# Patient Record
Sex: Male | Born: 1965 | Race: White | Hispanic: No | Marital: Married | Smoking: Current every day smoker
Health system: Southern US, Community
[De-identification: ages and names within clinical notes are randomized; demographics above are authoritative.]

## PROBLEM LIST (undated history)

## (undated) DIAGNOSIS — G510 Bell's palsy: Secondary | ICD-10-CM

## (undated) DIAGNOSIS — T7840XA Allergy, unspecified, initial encounter: Secondary | ICD-10-CM

## (undated) DIAGNOSIS — R7303 Prediabetes: Secondary | ICD-10-CM

## (undated) DIAGNOSIS — E785 Hyperlipidemia, unspecified: Secondary | ICD-10-CM

## (undated) HISTORY — PX: CARPAL TUNNEL RELEASE: SHX101

## (undated) HISTORY — PX: KNEE SURGERY: SHX244

## (undated) HISTORY — DX: Bell's palsy: G51.0

## (undated) HISTORY — DX: Allergy, unspecified, initial encounter: T78.40XA

## (undated) HISTORY — DX: Hyperlipidemia, unspecified: E78.5

## (undated) HISTORY — DX: Prediabetes: R73.03

---

## 2015-10-27 ENCOUNTER — Telehealth: Payer: Self-pay | Admitting: Family Medicine

## 2015-10-27 NOTE — Telephone Encounter (Signed)
Pt given appt 7/14 at 2:25 with Dr.Dettinger. Pt aware to arrive 30 minutes prior.

## 2015-10-28 ENCOUNTER — Ambulatory Visit: Payer: Self-pay | Admitting: Family Medicine

## 2015-11-07 ENCOUNTER — Ambulatory Visit (INDEPENDENT_AMBULATORY_CARE_PROVIDER_SITE_OTHER): Payer: 59 | Admitting: Family Medicine

## 2015-11-07 ENCOUNTER — Encounter: Payer: Self-pay | Admitting: Family Medicine

## 2015-11-07 VITALS — BP 111/58 | HR 78 | Temp 97.4°F | Ht 72.0 in | Wt 231.4 lb

## 2015-11-07 DIAGNOSIS — N400 Enlarged prostate without lower urinary tract symptoms: Secondary | ICD-10-CM

## 2015-11-07 DIAGNOSIS — R1032 Left lower quadrant pain: Secondary | ICD-10-CM

## 2015-11-07 DIAGNOSIS — Z0000 Encounter for general adult medical examination without abnormal findings: Secondary | ICD-10-CM | POA: Diagnosis not present

## 2015-11-07 DIAGNOSIS — R300 Dysuria: Secondary | ICD-10-CM | POA: Diagnosis not present

## 2015-11-07 DIAGNOSIS — Z87442 Personal history of urinary calculi: Secondary | ICD-10-CM | POA: Diagnosis not present

## 2015-11-07 DIAGNOSIS — R079 Chest pain, unspecified: Secondary | ICD-10-CM | POA: Insufficient documentation

## 2015-11-07 DIAGNOSIS — R7303 Prediabetes: Secondary | ICD-10-CM

## 2015-11-07 LAB — URINALYSIS, COMPLETE
BILIRUBIN UA: NEGATIVE
Ketones, UA: NEGATIVE
Leukocytes, UA: NEGATIVE
NITRITE UA: NEGATIVE
Protein, UA: NEGATIVE
Specific Gravity, UA: >1.03 — ABNORMAL HIGH (ref 1.005–1.030)
UUROB: 0.2 mg/dL (ref 0.2–1.0)
pH, UA: 5.5 (ref 5.0–7.5)

## 2015-11-07 LAB — BAYER DCA HB A1C WAIVED: HB A1C: 6.7 % (ref <7.0)

## 2015-11-07 LAB — MICROSCOPIC EXAMINATION
Epithelial Cells (non renal): NONE SEEN /hpf (ref 0–10)
WBC UA: NONE SEEN /HPF (ref 0–?)

## 2015-11-07 MED ORDER — TAMSULOSIN HCL 0.4 MG PO CAPS: 0.4000 mg | ORAL_CAPSULE | Freq: Every day | ORAL | Status: DC

## 2015-11-07 NOTE — Patient Instructions (Signed)
Great to see yoU!  Lets have you back in 2 weeks to see how you are doing.   We will call within 1 week with labs.

## 2015-11-07 NOTE — Progress Notes (Signed)
 HPI  Patient presents today here with left lower quadrant pain to establish care.  Patient explains that over the last 1 month he's had left lower quadrant pain. He states that a few years ago he first had left flank pain which resolved after passing a kidney stone. About a month ago he had similar pain that he does currently, left lower quadrant dull achy persistent pain, that was resolved after passing another kidney stone. He states that however the last few weeks he's had it intermittently along with difficulty passing urine. He also has occasional dysuria.  He states that currently he has no problems passing urine. He does have continued left lower quadrant pain.  Left chest pain He has left-sided chest pain that resolved with pushing forcefully on his left chest port in towards his shoulder. He denies any exertional symptoms, dyspnea associated, palpitations, or leg edema.  Prediabetes Previously told he had prediabetes He continues to drink 10-12 bottles of Mountain Dew's daily He states that he has blurred vision intermittently which he attributes to high sugar.  He is amenable to getting colonoscopy.    PMH: Smoking status noted Past medical history of Bell's palsy and hyperlipidemia. His father and sister had stomach and breast cancer respectively He is a history of carpal tunnel release and left knee surgery. He is a smoker  ROS: Per HPI  Objective: BP (!) 111/58   Pulse 78   Temp 97.4 F (36.3 C) (Oral)   Ht 6' (1.829 m)   Wt 231 lb 6.4 oz (105 kg)   BMI 31.38 kg/m  Gen: NAD, alert, cooperative with exam HEENT: NCAT, EOMI, PERRL CV: RRR, good S1/S2, no murmur  chest wall: Pain reproduced with palpation of the left sternal border and left pectoral muscle Resp: CTABL, no wheezes, non-labored Abd: Soft, mild tenderness to palpation left lower quadrant Ext: No edema, warm Neuro: Alert and oriented DRE: Large smooth prostate, normal rectal tone  EKG with  normal sinus rhythm  Assessment and plan:  # Left lower quadrant pain With history of renal stones and persistent pain similar to previous episodes of passing stones I have ordered a CT renal stone study to evaluate for large stone No active stone today, urinalysis is negative for blood Also consider diverticulosis His abdominal exam today is reassuring  # BPH Start Flomax, if he has stone this could also be beneficial. PSA  # Prediabetes Labs today including A1c Discussed cutting back on sugar sweetened soft drinks  # Chest pain Reproducible on exam Given risk factors I think he likely warrants stress test. Referral to cardiology  Healthcare maintenance Refer to GI for colonoscopy, hep C also checked     Orders Placed This Encounter  Procedures  . Microscopic Examination  . CT RENAL STONE STUDY    Standing Status:   Future    Standing Expiration Date:   02/06/2017    Order Specific Question:   Reason for Exam (SYMPTOM  OR DIAGNOSIS REQUIRED)    Answer:   LLQ pain with Hx of stone    Order Specific Question:   Preferred imaging location?    Answer:   [facility]  . Urinalysis, Complete  . CMP14+EGFR  . PSA  . CBC with Differential  . Lipid Panel  . Bayer DCA Hb A1c Waived  . TSH  . Hepatitis C antibody  . Ambulatory referral to Cardiology    Referral Priority:   Routine    Referral Type:   Consultation      Referral Reason:   Specialty Services Required    Requested Specialty:   Cardiology    Number of Visits Requested:   1  . Ambulatory referral to Gastroenterology    Referral Priority:   Routine    Referral Type:   Consultation    Referral Reason:   Specialty Services Required    Number of Visits Requested:   1  . EKG 12-Lead    Meds ordered this encounter  Medications  . tamsulosin (FLOMAX) capsule 0.4 mg    Laroy Apple, MD [facility] 11/07/2015, 5:46 PM

## 2015-11-08 ENCOUNTER — Telehealth: Payer: Self-pay | Admitting: Family Medicine

## 2015-11-08 ENCOUNTER — Encounter: Payer: Self-pay | Admitting: Gastroenterology

## 2015-11-08 LAB — CBC WITH DIFFERENTIAL/PLATELET
BASOS ABS: 0.1 10*3/uL (ref 0.0–0.2)
BASOS: 1 %
EOS (ABSOLUTE): 0.2 10*3/uL (ref 0.0–0.4)
Eos: 2 %
HEMOGLOBIN: 14.7 g/dL (ref 12.6–17.7)
Hematocrit: 42.5 % (ref 37.5–51.0)
IMMATURE GRANS (ABS): 0 10*3/uL (ref 0.0–0.1)
IMMATURE GRANULOCYTES: 0 %
LYMPHS: 31 %
Lymphocytes Absolute: 2.5 10*3/uL (ref 0.7–3.1)
MCH: 30.8 pg (ref 26.6–33.0)
MCHC: 34.6 g/dL (ref 31.5–35.7)
MCV: 89 fL (ref 79–97)
MONOCYTES: 6 %
Monocytes Absolute: 0.5 10*3/uL (ref 0.1–0.9)
NEUTROS ABS: 4.8 10*3/uL (ref 1.4–7.0)
NEUTROS PCT: 60 %
PLATELETS: 301 10*3/uL (ref 150–379)
RBC: 4.77 x10E6/uL (ref 4.14–5.80)
RDW: 14.2 % (ref 12.3–15.4)
WBC: 8 10*3/uL (ref 3.4–10.8)

## 2015-11-08 LAB — LIPID PANEL
CHOLESTEROL TOTAL: 248 mg/dL — AB (ref 100–199)
Chol/HDL Ratio: 6.9 ratio units — ABNORMAL HIGH (ref 0.0–5.0)
HDL: 36 mg/dL — AB (ref >39)
LDL CALC: 164 mg/dL — AB (ref 0–99)
Triglycerides: 238 mg/dL — ABNORMAL HIGH (ref 0–149)
VLDL CHOLESTEROL CAL: 48 mg/dL — AB (ref 5–40)

## 2015-11-08 LAB — CMP14+EGFR
ALBUMIN: 4.3 g/dL (ref 3.5–5.5)
ALT: 20 IU/L (ref 0–44)
AST: 14 IU/L (ref 0–40)
Albumin/Globulin Ratio: 2 (ref 1.2–2.2)
Alkaline Phosphatase: 67 IU/L (ref 39–117)
BUN / CREAT RATIO: 14 (ref 9–20)
BUN: 15 mg/dL (ref 6–24)
Bilirubin Total: 0.3 mg/dL (ref 0.0–1.2)
CALCIUM: 9.4 mg/dL (ref 8.7–10.2)
CHLORIDE: 100 mmol/L (ref 96–106)
CO2: 27 mmol/L (ref 18–29)
CREATININE: 1.1 mg/dL (ref 0.76–1.27)
GFR calc non Af Amer: 77 mL/min/{1.73_m2} (ref >59)
GFR, EST AFRICAN AMERICAN: 89 mL/min/{1.73_m2} (ref >59)
GLUCOSE: 112 mg/dL — AB (ref 65–99)
Globulin, Total: 2.2 g/dL (ref 1.5–4.5)
Potassium: 5.1 mmol/L (ref 3.5–5.2)
Sodium: 140 mmol/L (ref 134–144)
TOTAL PROTEIN: 6.5 g/dL (ref 6.0–8.5)

## 2015-11-08 LAB — PSA: Prostate Specific Ag, Serum: 0.2 ng/mL (ref 0.0–4.0)

## 2015-11-08 LAB — TSH: TSH: 1.33 u[IU]/mL (ref 0.450–4.500)

## 2015-11-08 MED ORDER — TAMSULOSIN HCL 0.4 MG PO CAPS: 0.4000 mg | ORAL_CAPSULE | Freq: Every day | ORAL | 3 refills | Status: DC

## 2015-11-08 NOTE — Telephone Encounter (Signed)
Called and got his wife. I did not disclose labs results as there is no ROI on file.   She heard me to talk to him about smoking and drinking less mountain dew  Will attempt call again Thursday.   Laroy Apple, MD [facility] 11/08/2015, 5:20 PM

## 2015-11-08 NOTE — Telephone Encounter (Signed)
Medication sent to pharmacy  

## 2015-11-09 LAB — SPECIMEN STATUS REPORT

## 2015-11-09 LAB — HEPATITIS C ANTIBODY: Hep C Virus Ab: <0.1 s/co ratio (ref 0.0–0.9)

## 2015-11-10 ENCOUNTER — Telehealth: Payer: Self-pay | Admitting: Family Medicine

## 2015-11-10 NOTE — Telephone Encounter (Signed)
Review labs, no answer on his cell phone or home phone, message left that we will try again.  Laroy Apple, MD [facility] 11/10/2015, 10:53 AM

## 2015-11-11 ENCOUNTER — Telehealth: Payer: Self-pay | Admitting: Family Medicine

## 2015-11-11 ENCOUNTER — Encounter: Payer: Self-pay | Admitting: Family Medicine

## 2015-11-11 ENCOUNTER — Ambulatory Visit (HOSPITAL_COMMUNITY): Payer: 59

## 2015-11-11 MED ORDER — ATORVASTATIN CALCIUM 20 MG PO TABS: 20.0000 mg | ORAL_TABLET | Freq: Every day | ORAL | 3 refills | Status: AC

## 2015-11-11 NOTE — Telephone Encounter (Signed)
Called and discussed his labs.  He has new diabetes diagnosis. I explained that his diabetes is in the mild range and if he would stop sugar sweetened beverages he may be able to manage it without medication.  Hypercholesterolemia, start Lipitor.  Otherwise prostate and other labs are good.  Repeat LFTs and lipid panel in 6 weeks.  Laroy Apple, MD [facility] 11/11/2015, 5:33 PM

## 2015-11-16 NOTE — Telephone Encounter (Signed)
Left detailed message for patient to call back to set up appoint with clinical pharmacist. Several attempts to call patient. This encounter will be closed.

## 2015-11-16 NOTE — Telephone Encounter (Signed)
lmtcb

## 2015-11-28 ENCOUNTER — Ambulatory Visit (HOSPITAL_COMMUNITY)
Admission: RE | Admit: 2015-11-28 | Discharge: 2015-11-28 | Disposition: A | Discharge status: Home / Self Care | Payer: 59 | Source: Ambulatory Visit | Attending: Family Medicine | Admitting: Family Medicine

## 2015-11-28 ENCOUNTER — Encounter (HOSPITAL_COMMUNITY): Payer: Self-pay

## 2015-11-28 DIAGNOSIS — R1032 Left lower quadrant pain: Secondary | ICD-10-CM | POA: Diagnosis not present

## 2015-11-28 DIAGNOSIS — K8020 Calculus of gallbladder without cholecystitis without obstruction: Secondary | ICD-10-CM | POA: Insufficient documentation

## 2015-11-28 DIAGNOSIS — Z87442 Personal history of urinary calculi: Secondary | ICD-10-CM

## 2015-11-28 DIAGNOSIS — D3502 Benign neoplasm of left adrenal gland: Secondary | ICD-10-CM | POA: Insufficient documentation

## 2015-12-09 ENCOUNTER — Encounter: Payer: Self-pay | Admitting: Cardiology

## 2015-12-20 ENCOUNTER — Telehealth: Payer: Self-pay | Admitting: Physician Assistant

## 2015-12-20 MED ORDER — TAMSULOSIN HCL 0.4 MG PO CAPS: 0.4000 mg | ORAL_CAPSULE | Freq: Every day | ORAL | 3 refills | Status: DC

## 2015-12-20 NOTE — Telephone Encounter (Signed)
Prescription sent to pharmacy.

## 2015-12-26 ENCOUNTER — Encounter: Payer: Self-pay | Admitting: Cardiology

## 2015-12-26 ENCOUNTER — Ambulatory Visit (AMBULATORY_SURGERY_CENTER): Payer: Self-pay | Admitting: *Deleted

## 2015-12-26 ENCOUNTER — Ambulatory Visit (INDEPENDENT_AMBULATORY_CARE_PROVIDER_SITE_OTHER): Payer: 59 | Admitting: Cardiology

## 2015-12-26 VITALS — Ht 72.0 in | Wt 229.0 lb

## 2015-12-26 VITALS — BP 116/68 | HR 66 | Ht 72.0 in | Wt 229.6 lb

## 2015-12-26 DIAGNOSIS — E785 Hyperlipidemia, unspecified: Secondary | ICD-10-CM

## 2015-12-26 DIAGNOSIS — R079 Chest pain, unspecified: Secondary | ICD-10-CM

## 2015-12-26 DIAGNOSIS — Z789 Other specified health status: Secondary | ICD-10-CM

## 2015-12-26 DIAGNOSIS — Z889 Allergy status to unspecified drugs, medicaments and biological substances status: Secondary | ICD-10-CM

## 2015-12-26 DIAGNOSIS — Z720 Tobacco use: Secondary | ICD-10-CM | POA: Diagnosis not present

## 2015-12-26 DIAGNOSIS — Z1211 Encounter for screening for malignant neoplasm of colon: Secondary | ICD-10-CM

## 2015-12-26 MED ORDER — NA SULFATE-K SULFATE-MG SULF 17.5-3.13-1.6 GM/177ML PO SOLN: 1.0000 | Freq: Once | ORAL | 0 refills | Status: AC

## 2015-12-26 NOTE — Progress Notes (Signed)
No egg or soy allergy known to patient  No issues with past sedation with any surgeries  or procedures, no intubation problems  No diet pills per patient No home 02 use per patient  No blood thinners per patient  Pt denies issues with constipation  No A fib or A flutter   

## 2015-12-26 NOTE — Patient Instructions (Signed)
Medication Instructions:  The current medical regimen is effective;  continue present plan and medications.  Testing/Procedures: Your physician has requested that you have a myoview. For further information please visit [email or web address]. Please follow instruction sheet, as given.  Follow-Up: Follow up as needed after testing.  Thank you for choosing [facility]!!     

## 2015-12-26 NOTE — Progress Notes (Signed)
Cardiology Office Note    Date:  12/26/2015   ID:  Donald Nicholson, DOB 24-Aug-1965, MRN [id number]  PCP:  Donald File, MD  Cardiologist:   Donald Furbish, MD     History of Present Illness:  Donald Nicholson is a 50 y.o. male with hyperlipidemia here for the evaluation of chest pain at the request of Dr. Wendi Snipes.  His pain is in 2 separate areas on his chest wall below his left pectoralis muscle midsternal region and in the axillary region. He pointed to these regions. They do not seem to come on with exertional activity but can occur for instance while driving. He feels this wave of numbness as well and a sensation of blood movement through his arm gradually left arm and sometimes goes to his right arm as well. He has not had any rashes, no fevers, no syncopal episodes recently.  His risk factors are smoking, age. Blood pressure has been normal. Cholesterol moderately elevated with LDLs in the 150-160 range. He has tried statins in the past but has not had success because of muscle pain.  In 2012 he had an episode after working several days straight for 20 or so hours, where he fainted after bending over. He was seen in [place] and had several studies performed. MRI brain was normal, echocardiogram showed normal ejection fraction. Carotid Doppler showed mild plaque bilaterally.    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 Lipitor muscle ache 2 days after. Tobacco. Tried Chantix, made him "evil".    No early CAD history. GF lived to 100. Mother alive. Father died 71 possible cholangiocarcinoma. Brother valve issue.   Bilateral hand numbness. Has had carpal tunnel surgery.  When lifting his arms for a period of time, this makes him feel numb. He works on Doctor, general practice, travels quite a bit. He admits that he does not eat as well as he should.    Past Medical History:  Diagnosis Date  . Allergy   . Bell palsy   . Hyperlipidemia   . Pre-diabetes     Past Surgical History:  Procedure Laterality Date  .  CARPAL TUNNEL RELEASE Left   . KNEE SURGERY Left     Current Medications: Outpatient Medications Prior to Visit  Medication Sig Dispense Refill  . atorvastatin (LIPITOR) 20 MG tablet Take 1 tablet (20 mg total) by mouth daily. 90 tablet 3  . Cyanocobalamin (B-12) 2000 MCG TABS Take 1 tablet by mouth daily.     . Na Sulfate-K Sulfate-Mg Sulf (SUPREP BOWEL PREP KIT) 17.5-3.13-1.6 GM/180ML SOLN Take 1 kit by mouth once. suprep as directed. No substitutions 354 mL 0  . Probiotic Product (PROBIOTIC DAILY PO) Take 1 capsule by mouth daily.    . tamsulosin (FLOMAX) 0.4 MG CAPS capsule Take 1 capsule (0.4 mg total) by mouth daily. (Patient not taking: Reported on 12/26/2015) 90 capsule 3   No facility-administered medications prior to visit.      Allergies:   Codeine; Ezetimibe-simvastatin; Penicillins; Rosuvastatin calcium; and Varenicline   Social History   Social History  . Marital status: Married    Spouse name: N/A  . Number of children: N/A  . Years of education: N/A   Social History Main Topics  . Smoking status: Current Every Day Smoker    Packs/day: 1.00    Types: Cigarettes  . Smokeless tobacco: Never Used  . Alcohol use 2.4 oz/week    4 Shots of liquor per week     Comment: socially  . Drug use: No  .  Sexual activity: Not Asked   Other Topics Concern  . None   Social History Narrative  . None     Family History:  The patient's family history includes Breast cancer in his sister; Cancer in his father and sister; Stomach cancer in his father.   ROS:   Please see the history of present illness.   Chest pressure, hearing loss, leg pain, dizziness, somewhat difficulty urinating. ROS All other systems reviewed and are negative.   PHYSICAL EXAM:   VS:  BP 116/68   Pulse 66   Ht 6' (1.829 m)   Wt 229 lb 9.6 oz (104.1 kg)   BMI 31.14 kg/m    GEN: Well nourished, well developed, in no acute distress  HEENT: normal  Neck: no JVD, carotid bruits, or masses Cardiac:  RRR; no murmurs, rubs, or gallops,no edema  Respiratory:  clear to auscultation bilaterally, normal work of breathing GI: soft, nontender, nondistended, + BS MS: no deformity or atrophy  Skin: warm and dry, no rash Neuro:  Alert and Oriented x 3, Strength and sensation are intact Psych: euthymic mood, full affect  Wt Readings from Last 3 Encounters:  12/26/15 229 lb 9.6 oz (104.1 kg)  12/26/15 229 lb (103.9 kg)  11/07/15 231 lb 6.4 oz (105 kg)      Studies/Labs Reviewed:   EKG:  EKG is not ordered today.  Prior EKG reviewed which shows no significant abnormalities. Personally reviewed.  Recent Labs: 11/07/2015: ALT 20; BUN 15; Creatinine, Ser 1.10; Platelets 301; Potassium 5.1; Sodium 140; TSH 1.330   Lipid Panel    Component Value Date/Time   CHOL 248 (H) 11/07/2015 1519   TRIG 238 (H) 11/07/2015 1519   HDL 36 (L) 11/07/2015 1519   CHOLHDL 6.9 (H) 11/07/2015 1519   LDLCALC 164 (H) 11/07/2015 1519    Additional studies/ records that were reviewed today include:  Prior office notes, lab work, echocardiogram, carotid Doppler, MRI of brain reviewed.    ASSESSMENT:    1. Chest pain, unspecified chest pain type   2. Tobacco use   3. Hyperlipidemia   4. Statin intolerance      PLAN:  In order of problems listed above:  Atypical chest pain  - From the sounds of his description, likely neuropathic type pain. May be a cervical radiculopathy as well as chest wall discomfort. Nonetheless given his smoking history, age we will proceed with nuclear stress test. I want to make sure there are no high-risk features. Prior echocardiogram showed normal ejection fraction in 2012.  Tobacco use  - Encourage tobacco cessation. He has tried several medications, patches without success. We discussed further tactics at length.  Hyperlipidemia  - LDL in the 150-160 range. Has tried statins in the past without success.  Statin intolerance  - Muscle cramping after 2 days of  use.     Medication Adjustments/Labs and Tests Ordered: Current medicines are reviewed at length with the patient today.  Concerns regarding medicines are outlined above.  Medication changes, Labs and Tests ordered today are listed in the Patient Instructions below. Patient Instructions  Medication Instructions:  The current medical regimen is effective;  continue present plan and medications.  Testing/Procedures: Your physician has requested that you have a myoview. For further information please visit [email or web address] Please follow instruction sheet, as given.  Follow-Up: Follow up as needed after testing.  Thank you for choosing [facility]!!        Signed, Donato Schultz, MD  12/26/2015 11:31 AM  [facility] [place], [place], [place]  [address] Phone: [phone]; Fax: [phone]

## 2015-12-28 ENCOUNTER — Encounter: Payer: Self-pay | Admitting: Gastroenterology

## 2015-12-29 ENCOUNTER — Telehealth (HOSPITAL_COMMUNITY): Payer: Self-pay | Admitting: *Deleted

## 2015-12-29 NOTE — Telephone Encounter (Signed)
Left message on voicemail per DPR in reference to upcoming appointment scheduled on  01/02/16 with detailed instructions given per Myocardial Perfusion Study Information Sheet for the test. LM to arrive 15 minutes early, and that it is imperative to arrive on time for appointment to keep from having the test rescheduled. If you need to cancel or reschedule your appointment, please call the office within 24 hours of your appointment. Failure to do so may result in a cancellation of your appointment, and a $50 no show fee. Phone number given for call back for any questions. Kirstie Peri

## 2016-01-02 ENCOUNTER — Ambulatory Visit (HOSPITAL_COMMUNITY): Payer: 59 | Attending: Cardiology

## 2016-01-02 DIAGNOSIS — I2510 Atherosclerotic heart disease of native coronary artery without angina pectoris: Secondary | ICD-10-CM | POA: Insufficient documentation

## 2016-01-02 DIAGNOSIS — F17200 Nicotine dependence, unspecified, uncomplicated: Secondary | ICD-10-CM | POA: Diagnosis not present

## 2016-01-02 DIAGNOSIS — R079 Chest pain, unspecified: Secondary | ICD-10-CM

## 2016-01-02 LAB — MYOCARDIAL PERFUSION IMAGING
CHL CUP NUCLEAR SDS: 2
CHL CUP NUCLEAR SRS: 5
CHL RATE OF PERCEIVED EXERTION: 18
CSEPED: 10 min
CSEPEDS: 0 s
CSEPEW: 11.7 METS
CSEPHR: 92 %
LV dias vol: 95 mL (ref 62–150)
LVSYSVOL: 42 mL
MPHR: 169 {beats}/min
NUC STRESS TID: 1.06
Peak HR: 155 {beats}/min
RATE: 0.35
Rest HR: 64 {beats}/min
SSS: 7

## 2016-01-02 MED ORDER — TECHNETIUM TC 99M TETROFOSMIN IV KIT
32.5000 | PACK | Freq: Once | INTRAVENOUS | Status: AC | PRN
  Administered 2016-01-02: 33 via INTRAVENOUS
  Filled 2016-01-02: qty 33

## 2016-01-02 MED ORDER — TECHNETIUM TC 99M TETROFOSMIN IV KIT
10.3000 | PACK | Freq: Once | INTRAVENOUS | Status: AC | PRN
  Administered 2016-01-02: 10 via INTRAVENOUS
  Filled 2016-01-02: qty 10

## 2016-01-09 ENCOUNTER — Ambulatory Visit (AMBULATORY_SURGERY_CENTER): Payer: 59 | Admitting: Gastroenterology

## 2016-01-09 ENCOUNTER — Encounter: Payer: Self-pay | Admitting: Gastroenterology

## 2016-01-09 VITALS — BP 105/56 | HR 62 | Temp 98.0°F | Resp 12 | Ht 72.0 in | Wt 229.0 lb

## 2016-01-09 DIAGNOSIS — K635 Polyp of colon: Secondary | ICD-10-CM

## 2016-01-09 DIAGNOSIS — D124 Benign neoplasm of descending colon: Secondary | ICD-10-CM

## 2016-01-09 DIAGNOSIS — D125 Benign neoplasm of sigmoid colon: Secondary | ICD-10-CM

## 2016-01-09 DIAGNOSIS — Z1212 Encounter for screening for malignant neoplasm of rectum: Secondary | ICD-10-CM

## 2016-01-09 DIAGNOSIS — D128 Benign neoplasm of rectum: Secondary | ICD-10-CM

## 2016-01-09 DIAGNOSIS — D123 Benign neoplasm of transverse colon: Secondary | ICD-10-CM

## 2016-01-09 DIAGNOSIS — Z1211 Encounter for screening for malignant neoplasm of colon: Secondary | ICD-10-CM | POA: Diagnosis not present

## 2016-01-09 DIAGNOSIS — K621 Rectal polyp: Secondary | ICD-10-CM

## 2016-01-09 DIAGNOSIS — D122 Benign neoplasm of ascending colon: Secondary | ICD-10-CM

## 2016-01-09 MED ORDER — SODIUM CHLORIDE 0.9 % IV SOLN: 500.0000 mL | INTRAVENOUS | Status: AC

## 2016-01-09 NOTE — Progress Notes (Signed)
Called to room to assist during endoscopic procedure.  Patient ID and intended procedure confirmed with present staff. Received instructions for my participation in the procedure from the performing physician.  

## 2016-01-09 NOTE — Patient Instructions (Signed)
YOU HAD AN ENDOSCOPIC PROCEDURE TODAY AT [facility]:   Refer to the procedure report that was given to you for any specific questions about what was found during the examination.  If the procedure report does not answer your questions, please call your gastroenterologist to clarify.  If you requested that your care partner not be given the details of your procedure findings, then the procedure report has been included in a sealed envelope for you to review at your convenience later.  YOU SHOULD EXPECT: Some feelings of bloating in the abdomen. Passage of more gas than usual.  Walking can help get rid of the air that was put into your GI tract during the procedure and reduce the bloating. If you had a lower endoscopy (such as a colonoscopy or flexible sigmoidoscopy) you may notice spotting of blood in your stool or on the toilet paper. If you underwent a bowel prep for your procedure, you may not have a normal bowel movement for a few days.  Please Note:  You might notice some irritation and congestion in your nose or some drainage.  This is from the oxygen used during your procedure.  There is no need for concern and it should clear up in a day or so.  SYMPTOMS TO REPORT IMMEDIATELY:   Following lower endoscopy (colonoscopy or flexible sigmoidoscopy):  Excessive amounts of blood in the stool  Significant tenderness or worsening of abdominal pains  Swelling of the abdomen that is new, acute  Fever of 100F or higher   For urgent or emergent issues, a gastroenterologist can be reached at any hour by calling [phone].   DIET:  We do recommend a small meal at first, but then you may proceed to your regular diet.  Drink plenty of fluids but you should avoid alcoholic beverages for 24 hours.  ACTIVITY:  You should plan to take it easy for the rest of today and you should NOT DRIVE or use heavy machinery until tomorrow (because of the sedation medicines used during the test).     FOLLOW UP: Our staff will call the number listed on your records the next business day following your procedure to check on you and address any questions or concerns that you may have regarding the information given to you following your procedure. If we do not reach you, we will leave a message.  However, if you are feeling well and you are not experiencing any problems, there is no need to return our call.  We will assume that you have returned to your regular daily activities without incident.  If any biopsies were taken you will be contacted by phone or by letter within the next 1-3 weeks.  Please call us at [phone] if you have not heard about the biopsies in 3 weeks.    SIGNATURES/CONFIDENTIALITY: You and/or your care partner have signed paperwork which will be entered into your electronic medical record.  These signatures attest to the fact that that the information above on your After Visit Summary has been reviewed and is understood.  Full responsibility of the confidentiality of this discharge information lies with you and/or your care-partner.  You will need another colonoscopy in 5 years.   Thank-you for choosing [place] for your healthcare needs today.

## 2016-01-09 NOTE — Progress Notes (Signed)
Report to PACU, RN, vss, BBS= Clear.  

## 2016-01-09 NOTE — Op Note (Signed)
[facility] Patient Name: Donald Nicholson Procedure Date: 01/09/2016 9:44 AM MRN: [id number] Endoscopist: Ladene Artist , MD Age: 50 Referring MD:  Date of Birth: 05-04-1965 Gender: Male Account #: [id number] Procedure:                Colonoscopy Indications:              Screening for colorectal malignant neoplasm Medicines:                Monitored Anesthesia Care Procedure:                Pre-Anesthesia Assessment:                           - Prior to the procedure, a History and Physical                            was performed, and patient medications and                            allergies were reviewed. The patient's tolerance of                            previous anesthesia was also reviewed. The risks                            and benefits of the procedure and the sedation                            options and risks were discussed with the patient.                            All questions were answered, and informed consent                            was obtained. Prior Anticoagulants: The patient has                            taken no previous anticoagulant or antiplatelet                            agents. ASA Grade Assessment: II - A patient with                            mild systemic disease. After reviewing the risks                            and benefits, the patient was deemed in                            satisfactory condition to undergo the procedure.                           After obtaining informed consent, the colonoscope  was passed under direct vision. Throughout the                            procedure, the patient's blood pressure, pulse, and                            oxygen saturations were monitored continuously. The                            Model [id number]-[id number]) scope was introduced                            through the anus and advanced to the the cecum,                            identified by  appendiceal orifice and ileocecal                            valve. The ileocecal valve, appendiceal orifice,                            and rectum were photographed. The quality of the                            bowel preparation was good. The colonoscopy was                            performed without difficulty. The patient tolerated                            the procedure well. Scope In: [id number] AM Scope Out: 10:15:49 AM Scope Withdrawal Time: 0 hours 16 minutes 55 seconds  Total Procedure Duration: 0 hours 18 minutes 50 seconds  Findings:                 The perianal and digital rectal examinations were                            normal.                           A 5 mm polyp was found in the transverse colon. The                            polyp was sessile. The polyp was removed with a                            cold biopsy forceps. Resection and retrieval were                            complete.                           Five sessile polyps were found in the rectum,  sigmoid colon, descending colon, transverse colon                            and ascending colon. The polyps were 5 to 6 mm in                            size. These polyps were removed with a cold snare.                            Resection and retrieval were complete.                           The exam was otherwise without abnormality on                            direct and retroflexion views. Complications:            No immediate complications. Estimated blood loss:                            None. Estimated Blood Loss:     Estimated blood loss: none. Impression:               - One 5 mm polyp in the transverse colon, removed                            with a cold biopsy forceps. Resected and retrieved.                           - Five 5 to 6 mm polyps in the rectum, in the                            sigmoid colon, in the descending colon, in the                             transverse colon and in the ascending colon,                            removed with a cold snare. Resected and retrieved.                           - The examination was otherwise normal on direct                            and retroflexion views. Recommendation:           - Repeat colonoscopy in 5 years for surveillance if                            polyp(s) precancerous, otherwise 10 years.                           - Patient has a contact number available for  emergencies. The signs and symptoms of potential                            delayed complications were discussed with the                            patient. Return to normal activities tomorrow.                            Written discharge instructions were provided to the                            patient.                           - Resume previous diet.                           - Continue present medications.                           - Await pathology results. Ladene Artist, MD 01/09/2016 10:20:06 AM This report has been signed electronically.

## 2016-01-10 ENCOUNTER — Telehealth: Payer: Self-pay | Admitting: *Deleted

## 2016-01-10 ENCOUNTER — Telehealth: Payer: Self-pay

## 2016-01-10 NOTE — Telephone Encounter (Signed)
  Follow up Call-  Call back number 01/09/2016  Post procedure Call Back phone  # [phone], [phone]  Permission to leave phone message Yes     Patient questions:  Do you have a fever, pain , or abdominal swelling? No. Pain Score  0 *  Have you tolerated food without any problems? Yes.    Have you been able to return to your normal activities? Yes.    Do you have any questions about your discharge instructions: Diet   No. Medications  No. Follow up visit  No.  Do you have questions or concerns about your Care? No.  Actions: * If pain score is 4 or above: No action needed, pain <4.

## 2016-01-10 NOTE — Telephone Encounter (Signed)
  Follow up Call-  Call back number 01/09/2016  Post procedure Call Back phone  # [phone], [phone]  Permission to leave phone message Yes    [facility]

## 2016-01-25 ENCOUNTER — Encounter: Payer: Self-pay | Admitting: Gastroenterology

## 2016-06-21 ENCOUNTER — Other Ambulatory Visit: Payer: Self-pay | Admitting: Family Medicine

## 2016-06-21 DIAGNOSIS — E278 Other specified disorders of adrenal gland: Secondary | ICD-10-CM

## 2016-06-21 DIAGNOSIS — E279 Disorder of adrenal gland, unspecified: Principal | ICD-10-CM

## 2016-07-12 ENCOUNTER — Ambulatory Visit (HOSPITAL_COMMUNITY): Payer: 59

## 2018-07-24 IMAGING — CT CT RENAL STONE PROTOCOL
2 of 3 series · 17 of 46 positions shown, 19 images · non-contrast
Comparison: None.

CLINICAL DATA: Left-sided abdominal pain.

EXAM:
CT ABDOMEN AND PELVIS WITHOUT CONTRAST
TECHNIQUE: Multidetector CT imaging of the abdomen and pelvis was performed
following the standard protocol without IV contrast.

[Series 2: stone under (id) w prev · axial · 0.85mm/px · z∈[+800,+1240]mm · 14 of 102 slices shown, 16 images]
[im 7/102  soft-tissue]
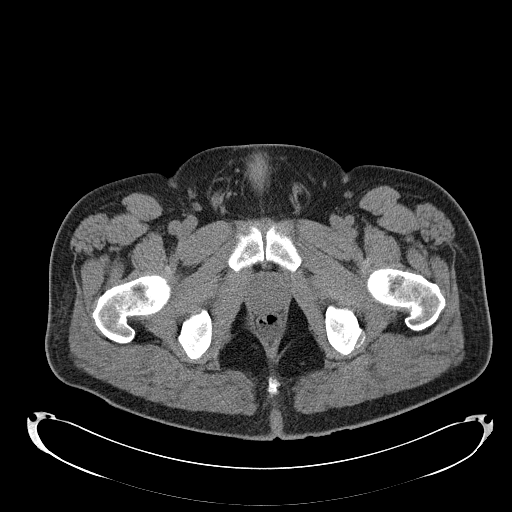
[im 7/102  bone]
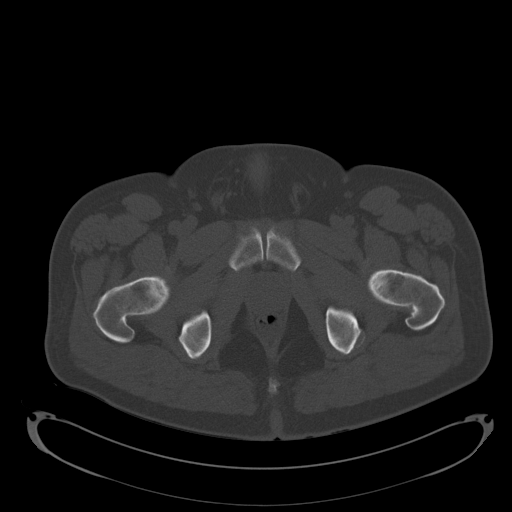
[im 14/102  soft-tissue]
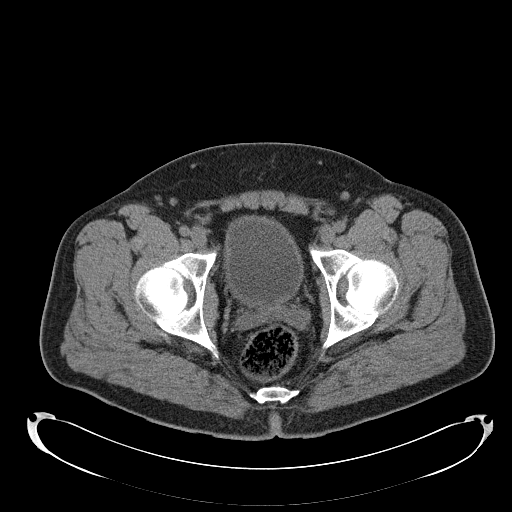
[im 20/102  soft-tissue]
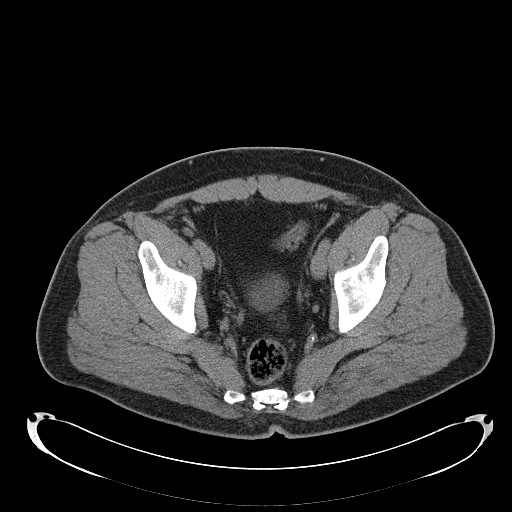
[im 27/102  soft-tissue]
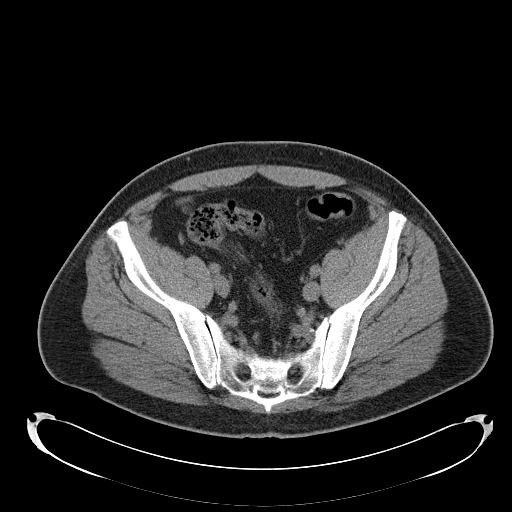
[im 33/102  soft-tissue]
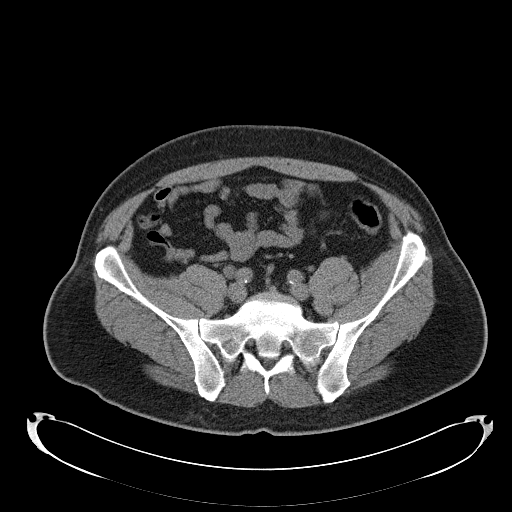
[im 40/102  soft-tissue]
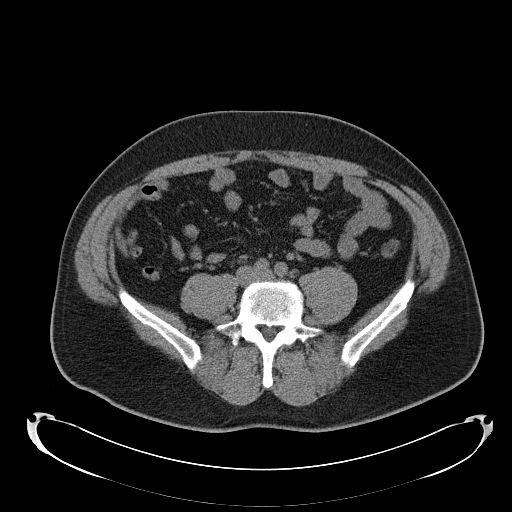
[im 46/102  soft-tissue]
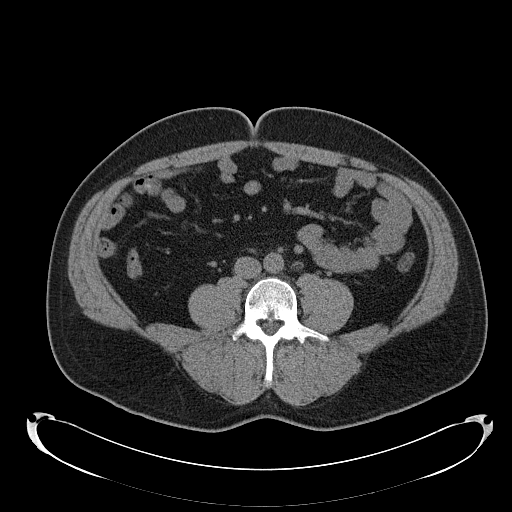
[im 56/102  soft-tissue]
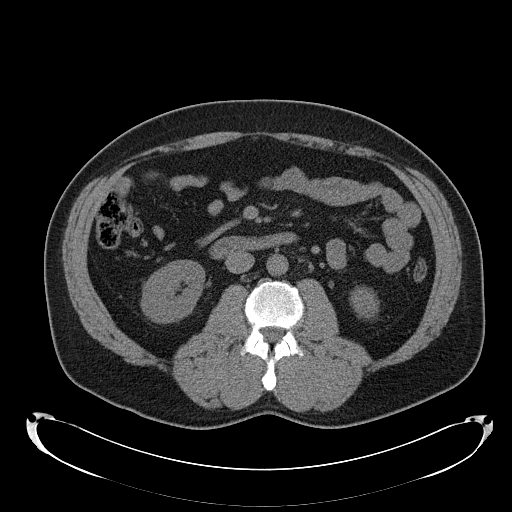
[im 62/102  soft-tissue]
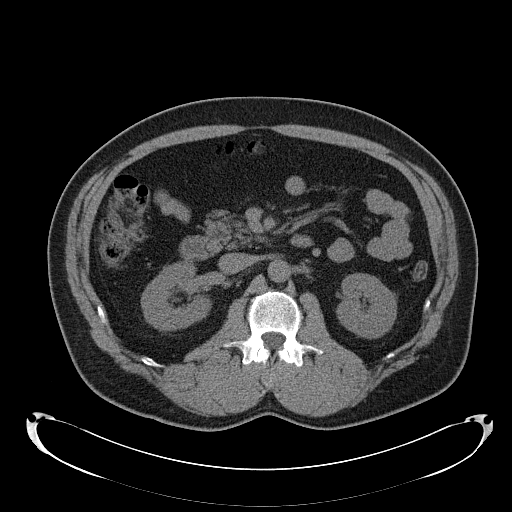
[im 62/102  bone]
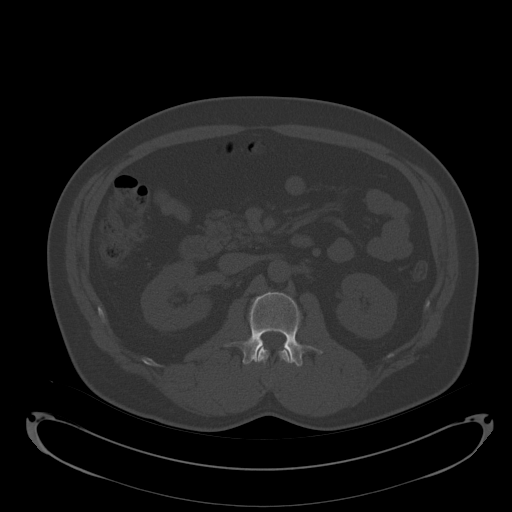
[im 69/102  soft-tissue]
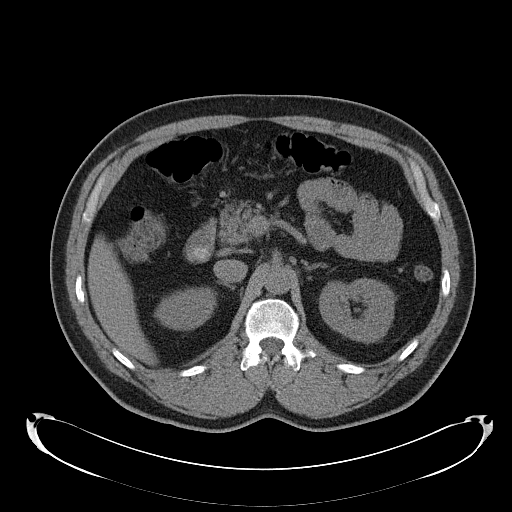
[im 75/102  soft-tissue]
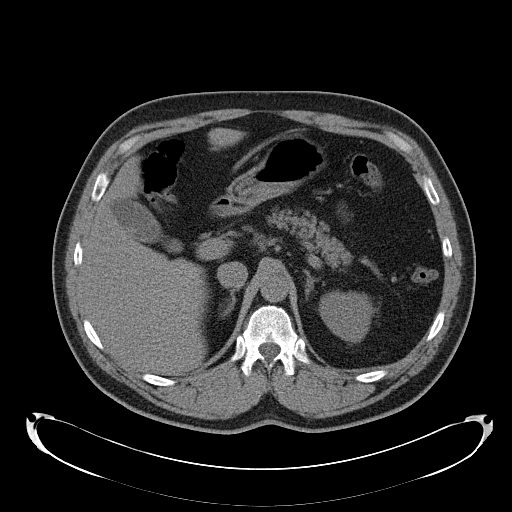
[im 82/102  soft-tissue]
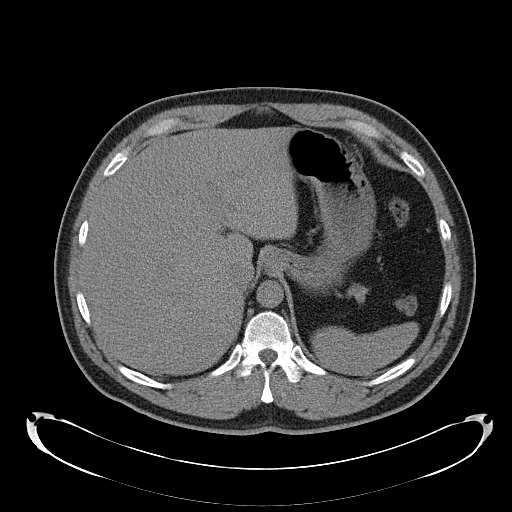
[im 88/102  soft-tissue]
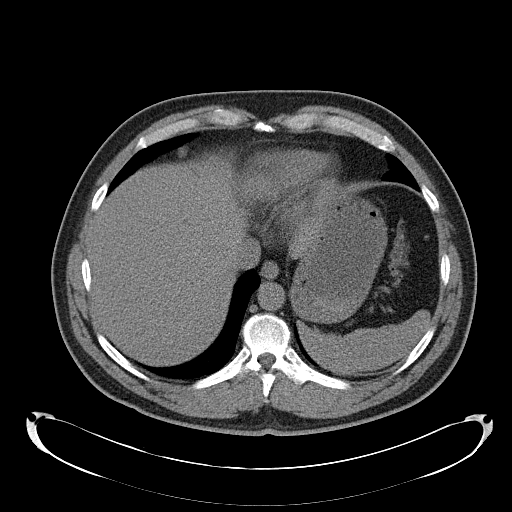
[im 95/102  soft-tissue]
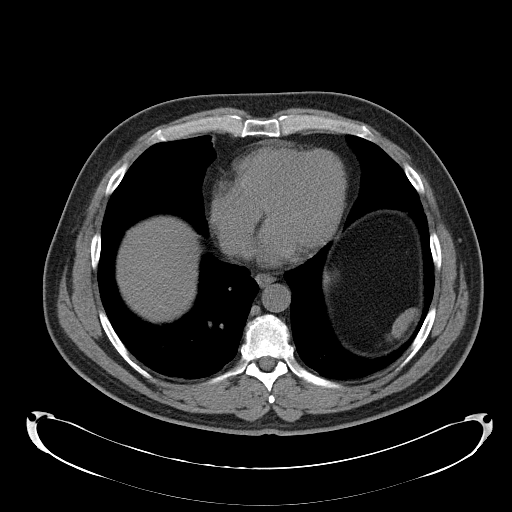

[Series 603: <mpr thick range(1)> · coronal · 1.00mm/px · 3 of 148 slices shown]
[im 50/148  soft-tissue]
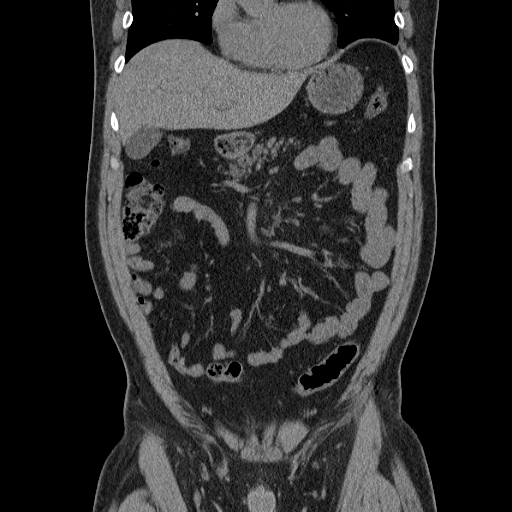
[im 66/148  soft-tissue]
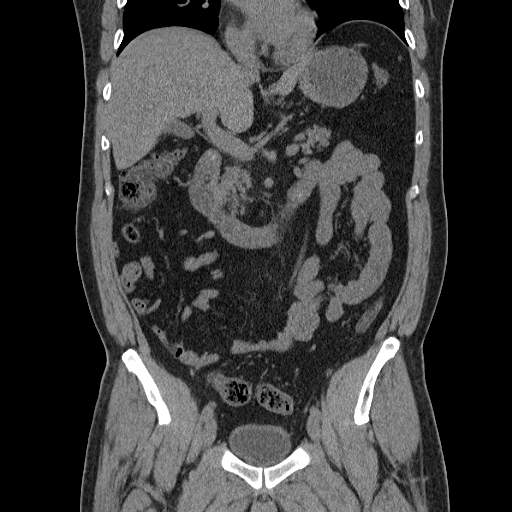
[im 82/148  soft-tissue]
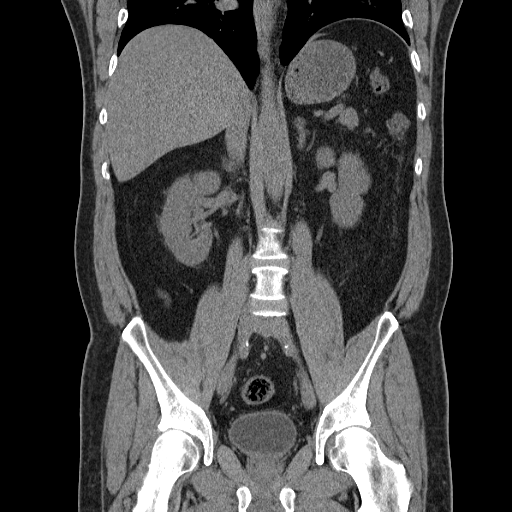

[17 of 46 positions shown; findings below may reference images not displayed]

FINDINGS: Lower chest:  No pleural effusion.  Normal heart size.

Hepatobiliary: Normal liver.  Tiny cholelithiasis.

Pancreas: Normal pancreas.

Spleen: Normal.

Adrenals/Urinary Tract: Normal right adrenal gland. 18 mm hypodense
left adrenal nodule measuring -1.8 Hounsfield units consistent with
adrenal adenoma. No urolithiasis or obstructive uropathy. Partially
decompressed bladder.

Stomach/Bowel: No bowel wall thickening or bowel dilatation. No
pneumatosis, pneumoperitoneum or portal venous gas. Normal appendix.
No abdominal or pelvic free fluid.

Vascular/Lymphatic: Normal caliber abdominal aorta. Abdominal aortic
atherosclerosis. No lymphadenopathy.

Other: No fluid collection or hematoma.

Musculoskeletal: No acute osseous abnormality. Degenerative disc
disease with disc height loss at L5-S1. Bilateral facet arthropathy
at L5-S1.
IMPRESSION: 1. No urolithiasis or obstructive uropathy.
2. Small 18 mm left adrenal adenoma.
3. Cholelithiasis.
4. Normal appendix.
